# Patient Record
Sex: Male | Born: 1990 | Race: Black or African American | Hispanic: No | Marital: Single | Smoking: Current every day smoker
Health system: Southern US, Community
[De-identification: ages and names within clinical notes are randomized; demographics above are authoritative.]

## PROBLEM LIST (undated history)

## (undated) DIAGNOSIS — J45909 Unspecified asthma, uncomplicated: Secondary | ICD-10-CM

---
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
## 2014-04-14 ENCOUNTER — Encounter (HOSPITAL_COMMUNITY): Payer: Self-pay | Admitting: Emergency Medicine

## 2014-04-14 ENCOUNTER — Emergency Department (HOSPITAL_COMMUNITY)
Admission: EM | Admit: 2014-04-14 | Discharge: 2014-04-14 | Disposition: A | Discharge status: Home / Self Care | Payer: Self-pay | Attending: Emergency Medicine | Admitting: Emergency Medicine

## 2014-04-14 ENCOUNTER — Emergency Department (HOSPITAL_COMMUNITY): Payer: Self-pay

## 2014-04-14 DIAGNOSIS — L02419 Cutaneous abscess of limb, unspecified: Secondary | ICD-10-CM | POA: Insufficient documentation

## 2014-04-14 DIAGNOSIS — F17200 Nicotine dependence, unspecified, uncomplicated: Secondary | ICD-10-CM | POA: Insufficient documentation

## 2014-04-14 DIAGNOSIS — L0291 Cutaneous abscess, unspecified: Secondary | ICD-10-CM

## 2014-04-14 DIAGNOSIS — J45909 Unspecified asthma, uncomplicated: Secondary | ICD-10-CM | POA: Insufficient documentation

## 2014-04-14 DIAGNOSIS — Z23 Encounter for immunization: Secondary | ICD-10-CM | POA: Insufficient documentation

## 2014-04-14 DIAGNOSIS — L03119 Cellulitis of unspecified part of limb: Principal | ICD-10-CM

## 2014-04-14 HISTORY — DX: Unspecified asthma, uncomplicated: J45.909

## 2014-04-14 MED ORDER — HYDROCODONE-ACETAMINOPHEN 5-325 MG PO TABS: 1.0000 | ORAL_TABLET | ORAL | Status: DC | PRN

## 2014-04-14 MED ORDER — SULFAMETHOXAZOLE-TRIMETHOPRIM 800-160 MG PO TABS: 1.0000 | ORAL_TABLET | Freq: Two times a day (BID) | ORAL | Status: DC

## 2014-04-14 MED ORDER — TETANUS-DIPHTH-ACELL PERTUSSIS 5-2.5-18.5 LF-MCG/0.5 IM SUSP
0.5000 mL | Freq: Once | INTRAMUSCULAR | Status: AC
  Administered 2014-04-14: 0.5 mL via INTRAMUSCULAR
  Filled 2014-04-14: qty 0.5

## 2014-04-14 MED ORDER — CEPHALEXIN 500 MG PO CAPS: 500.0000 mg | ORAL_CAPSULE | Freq: Four times a day (QID) | ORAL | Status: DC

## 2014-04-14 MED ORDER — HYDROCODONE-ACETAMINOPHEN 5-325 MG PO TABS
1.0000 | ORAL_TABLET | Freq: Once | ORAL | Status: AC
  Administered 2014-04-14: 1 via ORAL
  Filled 2014-04-14: qty 1

## 2014-04-14 NOTE — Discharge Instructions (Signed)
Abscess An abscess is an infected area that contains a collection of pus and debris.It can occur in almost any part of the body. An abscess is also known as a furuncle or boil. CAUSES  An abscess occurs when tissue gets infected. This can occur from blockage of oil or sweat glands, infection of hair follicles, or a minor injury to the skin. As the body tries to fight the infection, pus collects in the area and creates pressure under the skin. This pressure causes pain. People with weakened immune systems have difficulty fighting infections and get certain abscesses more often.  SYMPTOMS Usually an abscess develops on the skin and becomes a painful mass that is red, warm, and tender. If the abscess forms under the skin, you may feel a moveable soft area under the skin. Some abscesses break open (rupture) on their own, but most will continue to get worse without care. The infection can spread deeper into the body and eventually into the bloodstream, causing you to feel ill.  DIAGNOSIS  Your caregiver will take your medical history and perform a physical exam. A sample of fluid may also be taken from the abscess to determine what is causing your infection. TREATMENT  Your caregiver may prescribe antibiotic medicines to fight the infection. However, taking antibiotics alone usually does not cure an abscess. Your caregiver may need to make a small cut (incision) in the abscess to drain the pus. In some cases, gauze is packed into the abscess to reduce pain and to continue draining the area. HOME CARE INSTRUCTIONS   Only take over-the-counter or prescription medicines for pain, discomfort, or fever as directed by your caregiver.  If you were prescribed antibiotics, take them as directed. Finish them even if you start to feel better.  If gauze is used, follow your caregiver's directions for changing the gauze.  To avoid spreading the infection:  Keep your draining abscess covered with a  bandage.  Wash your hands well.  Do not share personal care items, towels, or whirlpools with others.  Avoid skin contact with others.  Keep your skin and clothes clean around the abscess.  Keep all follow-up appointments as directed by your caregiver. SEEK MEDICAL CARE IF:   You have increased pain, swelling, redness, fluid drainage, or bleeding.  You have muscle aches, chills, or a general ill feeling.  You have a fever. MAKE SURE YOU:   Understand these instructions.  Will watch your condition.  Will get help right away if you are not doing well or get worse. Document Released: 07/30/2005 Document Revised: 04/20/2012 Document Reviewed: 01/02/2012 [employer] Patient Information 2014 [employer]  

## 2014-04-14 NOTE — ED Provider Notes (Signed)
CSN: [id number]     Arrival date & time 04/14/14  1156 History  This chart was scribed for non-physician practitioner, Zachary LowerVrinda Tamira Ryland, FNP,working with Linwood DibblesJon Knapp, MD, by Karle PlumberJennifer Tensley, ED Scribe.  This patient was seen in room WTR6/WTR6 and the patient's care was started at 12:05 PM.   Chief Complaint  Patient presents with  . Wound Check  . Sore Throat   The history is provided by the patient. No language interpreter was used.   HPI Comments:  Zachary Carney is a 23 y.o. male who presents to the Emergency Department complaining of an insect bite that occurred approximately one week ago to his medial right knee. He states the area started out by looking like an ingrown hair and has gotten progressively bigger and more painful. He reports purulent drainage, swelling, and bleeding from the area. He reports applying rubbing alcohol and Hydrogen peroxide on the area with no relief. He denies fever or chills. He does not know when his last tetanus vaccination was received.   Past Medical History  Diagnosis Date  . Asthma    History reviewed. No pertinent past surgical history. No family history on file. History  Substance Use Topics  . Smoking status: Current Every Day Smoker -- 0.50 packs/day for 5 years    Types: Cigarettes  . Smokeless tobacco: Never Used  . Alcohol Use: Yes     Comment: Socially     Review of Systems  Constitutional: Negative for fever and chills.  Skin: Positive for wound (abscess to right medial knee).  All other systems reviewed and are negative.   Allergies  Review of patient's allergies indicates no known allergies.  Home Medications   Prior to Admission medications   Not on File   Triage Vitals: BP 112/76  Pulse 71  Temp(Src) 98.2 F (36.8 C) (Oral)  Resp 18  SpO2 100% Physical Exam  Nursing note and vitals reviewed. Constitutional: He is oriented to person, place, and time. He appears well-developed and well-nourished.  HENT:  Head:  Normocephalic and atraumatic.  Eyes: EOM are normal.  Neck: Normal range of motion.  Cardiovascular: Normal rate.   Pulmonary/Chest: Effort normal.  Musculoskeletal: Normal range of motion.  Neurological: He is alert and oriented to person, place, and time.  Skin: Skin is warm and dry. There is erythema.  Draining area on right medial knee with erythema and mild edema.  Psychiatric: He has a normal mood and affect. His behavior is normal.    ED Course  Procedures (including critical care time) DIAGNOSTIC STUDIES: Oxygen Saturation is 100% on RA, normal by my interpretation.   COORDINATION OF CARE: 12:10 PM- Will X-Ray area to see if there is bone involvement. Pt verbalizes understanding and agrees to plan.  Labs Review Labs Reviewed - No data to display  Imaging Review Dg Knee Complete 4 Views Right  04/14/2014   CLINICAL DATA:  Recent bug bite with developed soft tissue wound at medial RIGHT knee, skin infection  EXAM: RIGHT KNEE - COMPLETE 4+ VIEW  COMPARISON:  None  FINDINGS: Soft tissue lucency at medial aspect of RIGHT knee at this level of the superior aspect of the medial femoral condyle question soft tissue wound.  Osseous mineralization normal.  Joint spaces preserved.  No acute fracture, dislocation, or bone destruction.  No knee joint effusion.  IMPRESSION: No acute osseous findings.  Lucency at medial RIGHT knee question corresponding to patient's wound.   Electronically Signed   By: Angelyn PuntMark  Boles M.D.  On: 04/14/2014 13:16     EKG Interpretation None      MDM   Final diagnoses:  Abscess    Area is open and draining. No need for i&D. No osteo noted on x-ray. Will treat with keflex and bactrim and hydrocodone for pain  I personally performed the services described in this documentation, which was scribed in my presence. The recorded information has been reviewed and is accurate.     Zachary LowerVrinda Evelisse Szalkowski, NP 04/14/14 1325

## 2014-04-14 NOTE — ED Notes (Signed)
Pt reports that one week ago he developed a bump on the inner aspect of the right knee. Pt states that it worsened, which now is an open wound that is the circular and the size of a dime, 0.5 cm deep, bleeding controlled.  Pt also complains of a sore throat. Pt is A/O x4, in NAD, and vitals are WDL.

## 2014-04-19 NOTE — ED Provider Notes (Signed)
Medical screening examination/treatment/procedure(s) were performed by non-physician practitioner and as supervising physician I was immediately available for consultation/collaboration.    Linwood DibblesJon Brondon Wann, MD 04/19/14 1014

## 2015-04-03 IMAGING — CR DG KNEE COMPLETE 4+V*R*
4 series · 4 of 4 positions shown · non-contrast
Comparison: None

CLINICAL DATA: Recent bug bite with developed soft tissue wound at
medial RIGHT knee, skin infection

EXAM:
RIGHT KNEE - COMPLETE 4+ VIEW

[t knee ap right]
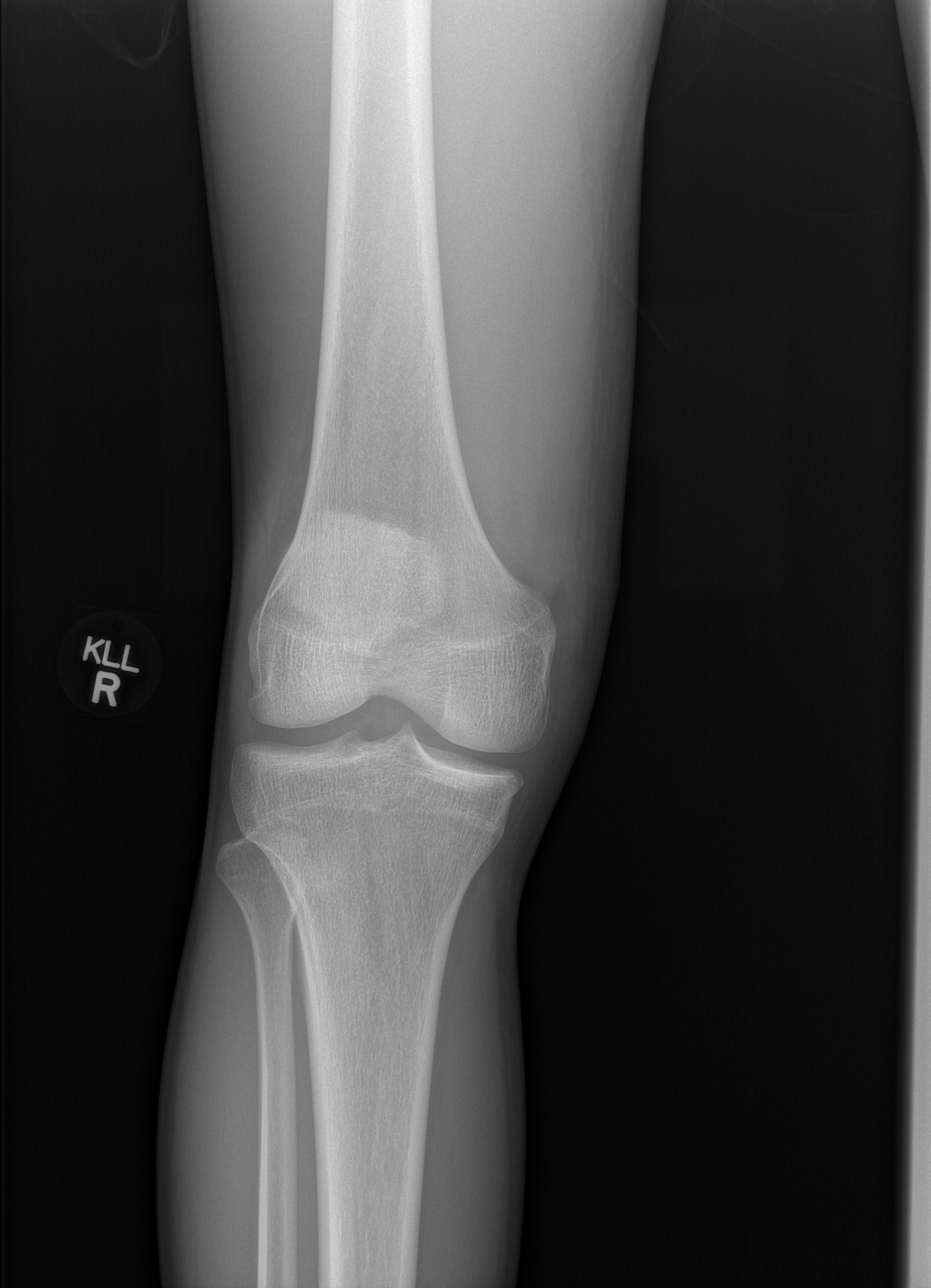

[t knee obl right (1 of 2)]
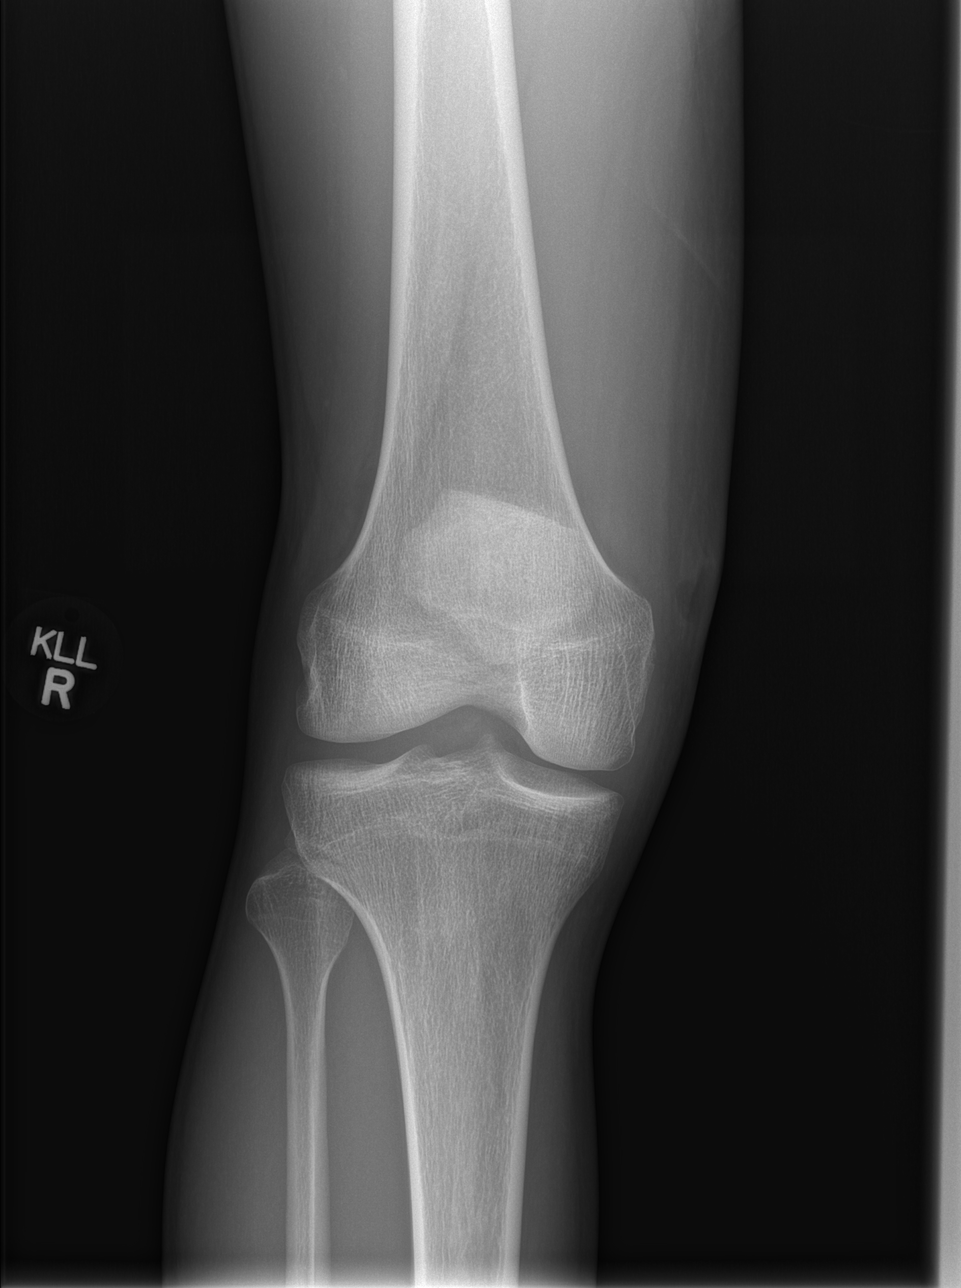

[t knee obl right (2 of 2)]
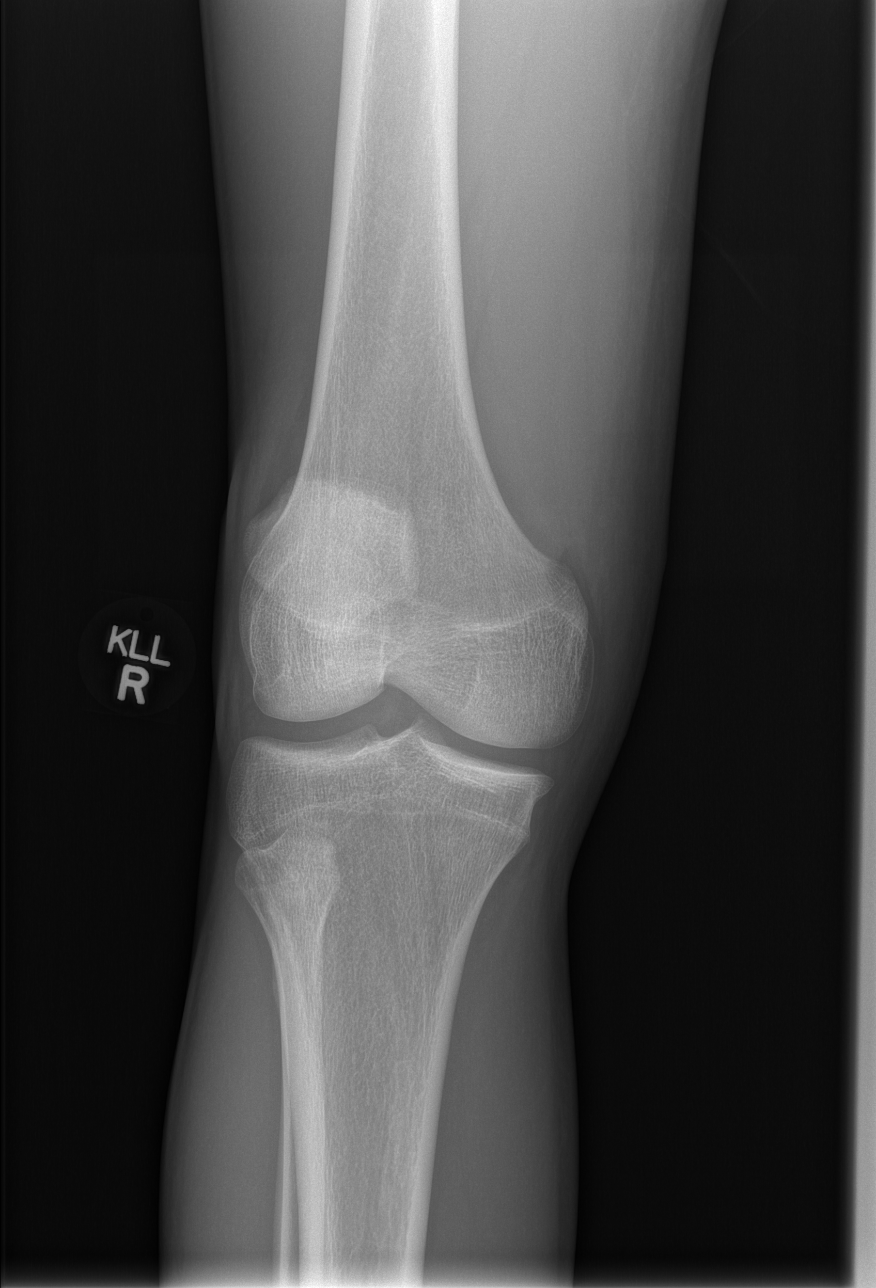

[t knee lat right]
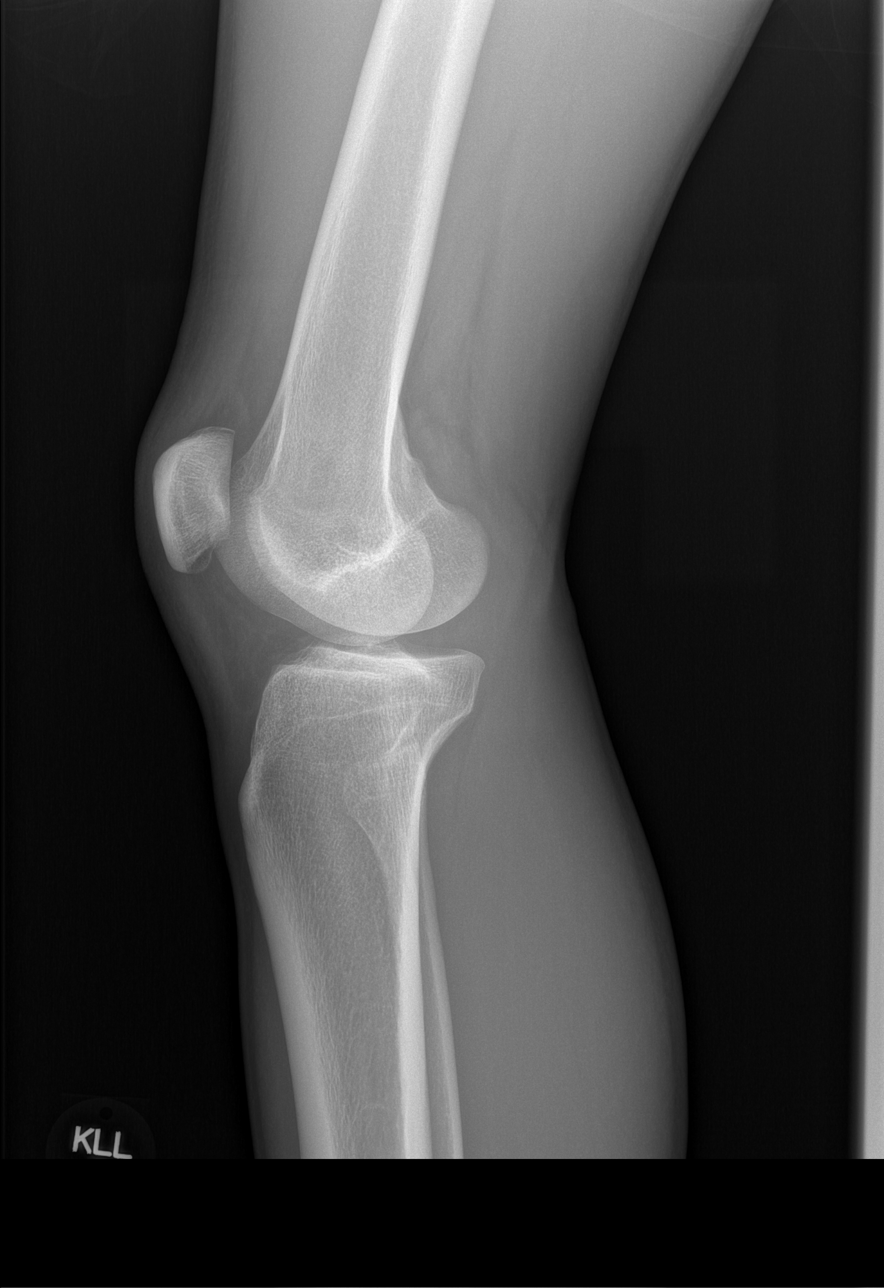

[4 of 4 positions shown; findings below may reference images not displayed]

FINDINGS: Soft tissue lucency at medial aspect of RIGHT knee at this level of
the superior aspect of the medial femoral condyle question soft
tissue wound.

Osseous mineralization normal.

Joint spaces preserved.

No acute fracture, dislocation, or bone destruction.

No knee joint effusion.
IMPRESSION: No acute osseous findings.

Lucency at medial RIGHT knee question corresponding to patient's
wound.

## 2015-09-25 ENCOUNTER — Emergency Department (HOSPITAL_BASED_OUTPATIENT_CLINIC_OR_DEPARTMENT_OTHER)
Admission: EM | Admit: 2015-09-25 | Discharge: 2015-09-25 | Disposition: A | Discharge status: Home / Self Care | Payer: Self-pay | Attending: Emergency Medicine | Admitting: Emergency Medicine

## 2015-09-25 ENCOUNTER — Encounter (HOSPITAL_BASED_OUTPATIENT_CLINIC_OR_DEPARTMENT_OTHER): Payer: Self-pay | Admitting: *Deleted

## 2015-09-25 DIAGNOSIS — J45909 Unspecified asthma, uncomplicated: Secondary | ICD-10-CM | POA: Insufficient documentation

## 2015-09-25 DIAGNOSIS — F17210 Nicotine dependence, cigarettes, uncomplicated: Secondary | ICD-10-CM | POA: Insufficient documentation

## 2015-09-25 DIAGNOSIS — L739 Follicular disorder, unspecified: Secondary | ICD-10-CM | POA: Insufficient documentation

## 2015-09-25 DIAGNOSIS — L731 Pseudofolliculitis barbae: Secondary | ICD-10-CM | POA: Insufficient documentation

## 2015-09-25 NOTE — Discharge Instructions (Signed)
It was our pleasure to provide your ER care today - we hope that you feel better.  Keep area very clean.  Warm, moist compresses to area.  Take tylenol/advil as need.  Return to ER if worse, new symptoms, fevers, spreading redness, severe pain, increased swelling, other concern.    Ingrown Hair An ingrown hair is a hair that curls and re-enters the skin instead of growing straight out of the skin. It happens most often with curly hair. It is usually more severe in the neck area, but it can occur in any shaved area, including the beard area, groin, scalp, and legs. An ingrown hair may cause small pockets of infection. CAUSES  Shaving closely, tweezing, or waxing, especially curly hair. Using hair removal creams can sometimes lead to ingrown hairs, especially in the groin. SYMPTOMS   Small bumps on the skin. The bumps may be filled with pus.  Pain.  Itching. DIAGNOSIS  Your caregiver can usually tell what is wrong by doing a physical exam. TREATMENT  If there is a severe infection, your caregiver may prescribe antibiotic medicines. Laser hair removal may also be done to help prevent regrowth of the hair. HOME CARE INSTRUCTIONS   Do not shave irritated skin. You may start shaving again once the irritation has gone away.  If you are prone to ingrown hairs, consider not shaving as much as possible.  If antibiotics are prescribed, take them as directed. Finish them even if you start to feel better.  You may use a facial sponge in a gentle circular motion to help dislodge ingrown hairs on the face.  You may use a hair removal cream weekly, especially on the legs and underarms. Stop using the cream if it irritates your skin. Use caution when using hair removal creams in the groin area. SHAVING INSTRUCTIONS AFTER TREATMENT  Shower before shaving. Keep areas to be shaved packed in warm, moist wraps for several minutes before shaving. The warm, moist environment helps soften the hairs  and makes ingrown hairs less likely to occur.  Use thick shaving gels.  Use a bump fighter razor that cuts hair slightly above the skin level or use an electric shaver with a longer shave setting.  Shave in the direction of hair growth. Avoid making multiple razor strokes.  Use moisturizing lotions after shaving.   This information is not intended to replace advice given to you by your health care provider. Make sure you discuss any questions you have with your health care provider.   Document Released: 01/26/2001 Document Revised: 04/20/2012 Document Reviewed: 01/20/2012 Elsevier Interactive Patient Education 2016 [employer]     Folliculitis Folliculitis is redness, soreness, and swelling (inflammation) of the hair follicles. This condition can occur anywhere on the body. People with weakened immune systems, diabetes, or obesity have a greater risk of getting folliculitis. CAUSES  Bacterial infection. This is the most common cause.  Fungal infection.  Viral infection.  Contact with certain chemicals, especially oils and tars. Long-term folliculitis can result from bacteria that live in the nostrils. The bacteria may trigger multiple outbreaks of folliculitis over time. SYMPTOMS Folliculitis most commonly occurs on the scalp, thighs, legs, back, buttocks, and areas where hair is shaved frequently. An early sign of folliculitis is a small, white or yellow, pus-filled, itchy lesion (pustule). These lesions appear on a red, inflamed follicle. They are usually less than 0.2 inches (5 mm) wide. When there is an infection of the follicle that goes deeper, it becomes a boil  or furuncle. A group of closely packed boils creates a larger lesion (carbuncle). Carbuncles tend to occur in hairy, sweaty areas of the body. DIAGNOSIS  Your caregiver can usually tell what is wrong by doing a physical exam. A sample may be taken from one of the lesions and tested in a lab. This can help determine  what is causing your folliculitis. TREATMENT  Treatment may include:  Applying warm compresses to the affected areas.  Taking antibiotic medicines orally or applying them to the skin.  Draining the lesions if they contain a large amount of pus or fluid.  Laser hair removal for cases of long-lasting folliculitis. This helps to prevent regrowth of the hair. HOME CARE INSTRUCTIONS  Apply warm compresses to the affected areas as directed by your caregiver.  If antibiotics are prescribed, take them as directed. Finish them even if you start to feel better.  You may take over-the-counter medicines to relieve itching.  Do not shave irritated skin.  Follow up with your caregiver as directed. SEEK IMMEDIATE MEDICAL CARE IF:   You have increasing redness, swelling, or pain in the affected area.  You have a fever. MAKE SURE YOU:  Understand these instructions.  Will watch your condition.  Will get help right away if you are not doing well or get worse.   This information is not intended to replace advice given to you by your health care provider. Make sure you discuss any questions you have with your health care provider.   Document Released: 12/29/2001 Document Revised: 11/10/2014 Document Reviewed: 01/20/2012 Elsevier Interactive Patient Education [employer]

## 2015-09-25 NOTE — ED Notes (Signed)
C/o abscess x 1 year that get better then worse. Abscess to inner left leg near groin area.

## 2015-09-25 NOTE — ED Provider Notes (Signed)
CSN: [id number]     Arrival date & time 09/25/15  1053 History   First MD Initiated Contact with Patient 09/25/15 1108     No chief complaint on file.    (Consider location/radiation/quality/duration/timing/severity/associated sxs/prior Treatment) The history is provided by the patient.  Patient c/o sore area on leg for the past year.  It is located on inner aspect proximal left thigh near crease in groin, on leg but adjacent to scrotum  States it seems to come and go, it bothers him at times, and not at others.  Denies any acute or abrupt change today.  States it comes and goes in size as well, sometimes being swollen, at other times note. No hx I and D.  Occasional scant drainage, like a pimple, but then goes away.  Pt states missed work and was told needed a note, so came to get checked.     Past Medical History  Diagnosis Date  . Asthma    History reviewed. No pertinent past surgical history. No family history on file. Social History  Substance Use Topics  . Smoking status: Current Every Day Smoker -- 0.50 packs/day for 5 years    Types: Cigarettes  . Smokeless tobacco: Never Used  . Alcohol Use: Yes     Comment: Socially     Review of Systems  Constitutional: Negative for fever and chills.  Genitourinary: Negative for penile swelling, scrotal swelling, genital sores and testicular pain.  Skin: Negative for rash.  Neurological: Negative for numbness.      Allergies  Review of patient's allergies indicates no known allergies.  Home Medications   Prior to Admission medications   Not on File   BP 115/83 mmHg  Pulse 71  Temp(Src) 97.7 F (36.5 C) (Oral)  Resp 18  Ht 6' (1.829 m)  Wt 74.844 kg  BMI 22.37 kg/m2  SpO2 100% Physical Exam  Constitutional: He appears well-developed and well-nourished. No distress.  HENT:  Head: Atraumatic.  Neck: Neck supple. No tracheal deviation present.  Cardiovascular: Normal rate.   Pulmonary/Chest: Effort normal. No  accessory muscle usage. No respiratory distress.  Genitourinary:  Normal ext exam, no ulcers/lesions. No scrotal or testicular swelling or tenderness.   Musculoskeletal: Normal range of motion. He exhibits no edema.  Neurological: He is alert.  Skin: Skin is warm and dry. No rash noted. He is not diaphoretic.  Pt with tiny, 1-2 mm diameter ingrown hair/folliculitis - pt mild pressure, expressed tiny amt pus.  No abscess, induration, mass or cellulitis.  No fem or inguinal l/a.   Psychiatric: He has a normal mood and affect.  Nursing note and vitals reviewed.   ED Course  Procedures (including critical care time)    I have personally reviewed and evaluated these images and lab results as part of my medical decision-making.    MDM   Reviewed nursing notes and prior charts for additional history.   Exam c/w mild/limited folliculitis/ingrown hair.      Cathren LaineKevin Tobie Perdue, MD 09/25/15 [phone]

## 2015-12-08 ENCOUNTER — Emergency Department (HOSPITAL_COMMUNITY)
Admission: EM | Admit: 2015-12-08 | Discharge: 2015-12-08 | Disposition: A | Discharge status: Home / Self Care | Payer: Self-pay | Attending: Emergency Medicine | Admitting: Emergency Medicine

## 2015-12-08 ENCOUNTER — Encounter (HOSPITAL_COMMUNITY): Payer: Self-pay | Admitting: Emergency Medicine

## 2015-12-08 DIAGNOSIS — F17210 Nicotine dependence, cigarettes, uncomplicated: Secondary | ICD-10-CM | POA: Insufficient documentation

## 2015-12-08 DIAGNOSIS — R319 Hematuria, unspecified: Secondary | ICD-10-CM

## 2015-12-08 DIAGNOSIS — N390 Urinary tract infection, site not specified: Secondary | ICD-10-CM | POA: Insufficient documentation

## 2015-12-08 DIAGNOSIS — J45909 Unspecified asthma, uncomplicated: Secondary | ICD-10-CM | POA: Insufficient documentation

## 2015-12-08 LAB — URINALYSIS, ROUTINE W REFLEX MICROSCOPIC
Bilirubin Urine: NEGATIVE
Glucose, UA: NEGATIVE mg/dL
Ketones, ur: NEGATIVE mg/dL
Nitrite: NEGATIVE
Protein, ur: 100 mg/dL — AB
SPECIFIC GRAVITY, URINE: 1.039 — AB (ref 1.005–1.030)
pH: 6 (ref 5.0–8.0)

## 2015-12-08 LAB — CBC WITH DIFFERENTIAL/PLATELET
BASOS PCT: 0 %
Basophils Absolute: 0 10*3/uL (ref 0.0–0.1)
EOS ABS: 0.1 10*3/uL (ref 0.0–0.7)
Eosinophils Relative: 1 %
HCT: 44.2 % (ref 39.0–52.0)
Hemoglobin: 14.7 g/dL (ref 13.0–17.0)
LYMPHS PCT: 40 %
Lymphs Abs: 2.5 10*3/uL (ref 0.7–4.0)
MCH: 31.3 pg (ref 26.0–34.0)
MCHC: 33.3 g/dL (ref 30.0–36.0)
MCV: 94 fL (ref 78.0–100.0)
MONO ABS: 0.7 10*3/uL (ref 0.1–1.0)
Monocytes Relative: 10 %
Neutro Abs: 3.1 10*3/uL (ref 1.7–7.7)
Neutrophils Relative %: 49 %
Platelets: 209 10*3/uL (ref 150–400)
RBC: 4.7 MIL/uL (ref 4.22–5.81)
RDW: 12.4 % (ref 11.5–15.5)
WBC: 6.4 10*3/uL (ref 4.0–10.5)

## 2015-12-08 LAB — BASIC METABOLIC PANEL
Anion gap: 8 (ref 5–15)
BUN: 21 mg/dL — ABNORMAL HIGH (ref 6–20)
CO2: 26 mmol/L (ref 22–32)
CREATININE: 0.79 mg/dL (ref 0.61–1.24)
Calcium: 8.8 mg/dL — ABNORMAL LOW (ref 8.9–10.3)
Chloride: 106 mmol/L (ref 101–111)
GFR calc Af Amer: >60 mL/min (ref >60)
GLUCOSE: 99 mg/dL (ref 65–99)
POTASSIUM: 4 mmol/L (ref 3.5–5.1)
SODIUM: 140 mmol/L (ref 135–145)

## 2015-12-08 LAB — URINE MICROSCOPIC-ADD ON

## 2015-12-08 MED ORDER — AZITHROMYCIN 250 MG PO TABS
1000.0000 mg | ORAL_TABLET | Freq: Once | ORAL | Status: AC
  Administered 2015-12-08: 1000 mg via ORAL
  Filled 2015-12-08: qty 4

## 2015-12-08 MED ORDER — CEFTRIAXONE SODIUM 1 G IJ SOLR
1.0000 g | Freq: Once | INTRAMUSCULAR | Status: AC
  Administered 2015-12-08: 1 g via INTRAMUSCULAR
  Filled 2015-12-08: qty 10

## 2015-12-08 MED ORDER — CEPHALEXIN 500 MG PO CAPS: 500.0000 mg | ORAL_CAPSULE | Freq: Four times a day (QID) | ORAL | Status: AC

## 2015-12-08 MED ORDER — LIDOCAINE HCL (PF) 1 % IJ SOLN
INTRAMUSCULAR | Status: AC
  Administered 2015-12-08: 5 mL
  Filled 2015-12-08: qty 5

## 2015-12-08 MED ORDER — LIDOCAINE HCL (PF) 1 % IJ SOLN
INTRAMUSCULAR | Status: AC
  Filled 2015-12-08: qty 5

## 2015-12-08 NOTE — ED Notes (Signed)
Pt states blood in urine started about four days ago.  Painful urination at first and when patient started seeing blood in urine it was a little pain but not as much as the initial pain without visualized blood.  Patient stated he had rough sexual activity a day or two before this episode.  Urination is not difficult at this time, he just sees blood and he feels as though he has to"pee" all the time with weakness.

## 2015-12-08 NOTE — ED Provider Notes (Signed)
CSN: [id number]     Arrival date & time 12/08/15  0147 History   First MD Initiated Contact with Patient 12/08/15 0226     Chief Complaint  Patient presents with  . Hematuria     (Consider location/radiation/quality/duration/timing/severity/associated sxs/prior Treatment) Patient is a 25 y.o. male presenting with hematuria. The history is provided by the patient. No language interpreter was used.  Hematuria This is a new problem. The current episode started in the past 7 days. The problem occurs constantly. Pertinent negatives include no abdominal pain, chills, fever, myalgias or vomiting. Associated symptoms comments: Patient presents for evaluation of blood in his urine for the past one week. He has urinary frequency and intermittent discomfort when urinating. No fever, nausea, flank pain. He denies penile discharge, scrotal swelling or testicular pain..    Past Medical History  Diagnosis Date  . Asthma    History reviewed. No pertinent past surgical history. History reviewed. No pertinent family history. Social History  Substance Use Topics  . Smoking status: Current Every Day Smoker -- 0.50 packs/day for 5 years    Types: Cigarettes  . Smokeless tobacco: Never Used  . Alcohol Use: Yes     Comment: Socially     Review of Systems  Constitutional: Negative for fever and chills.  Gastrointestinal: Negative.  Negative for vomiting and abdominal pain.  Genitourinary: Positive for dysuria and hematuria. Negative for flank pain, discharge, scrotal swelling and testicular pain.  Musculoskeletal: Negative.  Negative for myalgias.  Neurological: Negative.       Allergies  Review of patient's allergies indicates no known allergies.  Home Medications   Prior to Admission medications   Not on File   BP 105/61 mmHg  Pulse 51  Temp(Src) 97.9 F (36.6 C) (Oral)  Resp 18  Ht 6' (1.829 m)  Wt 68.04 kg  BMI 20.34 kg/m2  SpO2 100% Physical Exam  Constitutional: He is oriented  to person, place, and time. He appears well-developed and well-nourished.  Neck: Normal range of motion.  Pulmonary/Chest: Effort normal.  Genitourinary:  Circumcised penis. No discharge. No inguinal lymphadenopathy. No testicular tenderness.   Musculoskeletal: Normal range of motion.  Neurological: He is alert and oriented to person, place, and time.  Skin: Skin is warm and dry.  Psychiatric: He has a normal mood and affect.    ED Course  Procedures (including critical care time) Labs Review Labs Reviewed  URINALYSIS, ROUTINE W REFLEX MICROSCOPIC (NOT AT [facility]) - Abnormal; Notable for the following:    Color, Urine AMBER (*)    APPearance TURBID (*)    Specific Gravity, Urine 1.039 (*)    Hgb urine dipstick LARGE (*)    Protein, ur 100 (*)    Leukocytes, UA MODERATE (*)    All other components within normal limits  URINE MICROSCOPIC-ADD ON - Abnormal; Notable for the following:    Squamous Epithelial / LPF 0-5 (*)    Bacteria, UA RARE (*)    All other components within normal limits  URINE CULTURE  CBC WITH DIFFERENTIAL/PLATELET  BASIC METABOLIC PANEL  GC/CHLAMYDIA PROBE AMP ([place]) NOT AT [facility]    Imaging Review No results found. I have personally reviewed and evaluated these images and lab results as part of my medical decision-making.   EKG Interpretation None      MDM   Final diagnoses:  None    1. Hematuria 2. UTI  The patient has gross hematuria for 4 days, evidence of UTI in the department. He  is stable, normal vitals, afebrile. He is provided coverage for GC/Chlamydia and discharged on Keflex. Urology follow up was discussed with the patient as important in evaluation of bloody urine. He acknowledges understanding.    Elpidio Anis, PA-C 12/08/15 2306  Cy Blamer, MD 12/08/15 [phone]

## 2015-12-08 NOTE — Discharge Instructions (Signed)
Hematuria, Adult °Hematuria is blood in your urine. It can be caused by a bladder infection, kidney infection, prostate infection, kidney stone, or cancer of your urinary tract. Infections can usually be treated with medicine, and a kidney stone usually will pass through your urine. If neither of these is the cause of your hematuria, further workup to find out the reason may be needed. °It is very important that you tell your health care provider about any blood you see in your urine, even if the blood stops without treatment or happens without causing pain. Blood in your urine that happens and then stops and then happens again can be a symptom of a very serious condition. Also, pain is not a symptom in the initial stages of many urinary cancers. °HOME CARE INSTRUCTIONS  °· Drink lots of fluid, 3-4 quarts a day. If you have been diagnosed with an infection, cranberry juice is especially recommended, in addition to large amounts of water. °· Avoid caffeine, tea, and carbonated beverages because they tend to irritate the bladder. °· Avoid alcohol because it may irritate the prostate. °· Take all medicines as directed by your health care provider. °· If you were prescribed an antibiotic medicine, finish it all even if you start to feel better. °· If you have been diagnosed with a kidney stone, follow your health care provider's instructions regarding straining your urine to catch the stone. °· Empty your bladder often. Avoid holding urine for long periods of time. °· After a bowel movement, women should cleanse front to back. Use each tissue only once. °· Empty your bladder before and after sexual intercourse if you are a male. °SEEK MEDICAL CARE IF: °· You develop back pain. °· You have a fever. °· You have a feeling of sickness in your stomach (nausea) or vomiting. °· Your symptoms are not better in 3 days. Return sooner if you are getting worse. °SEEK IMMEDIATE MEDICAL CARE IF:  °· You develop severe vomiting and  are unable to keep the medicine down. °· You develop severe back or abdominal pain despite taking your medicines. °· You begin passing a large amount of blood or clots in your urine. °· You feel extremely weak or faint, or you pass out. °MAKE SURE YOU:  °· Understand these instructions. °· Will watch your condition. °· Will get help right away if you are not doing well or get worse. °  °This information is not intended to replace advice given to you by your health care provider. Make sure you discuss any questions you have with your health care provider. °  °Document Released: 10/20/2005 Document Revised: 11/10/2014 Document Reviewed: 06/20/2013 °[employer] Interactive Patient Education ©2016 [employer] ° °Urinary Tract Infection °Urinary tract infections (UTIs) can develop anywhere along your urinary tract. Your urinary tract is your body's drainage system for removing wastes and extra water. Your urinary tract includes two kidneys, two ureters, a bladder, and a urethra. Your kidneys are a pair of bean-shaped organs. Each kidney is about the size of your fist. They are located below your ribs, one on each side of your spine. °CAUSES °Infections are caused by microbes, which are microscopic organisms, including fungi, viruses, and bacteria. These organisms are so small that they can only be seen through a microscope. Bacteria are the microbes that most commonly cause UTIs. °SYMPTOMS  °Symptoms of UTIs may vary by age and gender of the patient and by the location of the infection. Symptoms in young women typically include a frequent   and intense urge to urinate and a painful, burning feeling in the bladder or urethra during urination. Older women and men are more likely to be tired, shaky, and weak and have muscle aches and abdominal pain. A fever may mean the infection is in your kidneys. Other symptoms of a kidney infection include pain in your back or sides below the ribs, nausea, and vomiting. °DIAGNOSIS °To  diagnose a UTI, your caregiver will ask you about your symptoms. Your caregiver will also ask you to provide a urine sample. The urine sample will be tested for bacteria and white blood cells. White blood cells are made by your body to help fight infection. °TREATMENT  °Typically, UTIs can be treated with medication. Because most UTIs are caused by a bacterial infection, they usually can be treated with the use of antibiotics. The choice of antibiotic and length of treatment depend on your symptoms and the type of bacteria causing your infection. °HOME CARE INSTRUCTIONS °· If you were prescribed antibiotics, take them exactly as your caregiver instructs you. Finish the medication even if you feel better after you have only taken some of the medication. °· Drink enough water and fluids to keep your urine clear or pale yellow. °· Avoid caffeine, tea, and carbonated beverages. They tend to irritate your bladder. °· Empty your bladder often. Avoid holding urine for long periods of time. °· Empty your bladder before and after sexual intercourse. °· After a bowel movement, women should cleanse from front to back. Use each tissue only once. °SEEK MEDICAL CARE IF:  °· You have back pain. °· You develop a fever. °· Your symptoms do not begin to resolve within 3 days. °SEEK IMMEDIATE MEDICAL CARE IF:  °· You have severe back pain or lower abdominal pain. °· You develop chills. °· You have nausea or vomiting. °· You have continued burning or discomfort with urination. °MAKE SURE YOU:  °· Understand these instructions. °· Will watch your condition. °· Will get help right away if you are not doing well or get worse. °  °This information is not intended to replace advice given to you by your health care provider. Make sure you discuss any questions you have with your health care provider. °  °Document Released: 07/30/2005 Document Revised: 07/11/2015 Document Reviewed: 11/28/2011 °[employer] Interactive Patient Education ©2016  [employer] ° °
